# Patient Record
Sex: Female | Born: 1990 | Race: White | Hispanic: No | Marital: Married | Smoking: Never smoker
Health system: Southern US, Community
[De-identification: ages and names within clinical notes are randomized; demographics above are authoritative.]

## PROBLEM LIST (undated history)

## (undated) DIAGNOSIS — G43909 Migraine, unspecified, not intractable, without status migrainosus: Secondary | ICD-10-CM

## (undated) HISTORY — DX: Migraine, unspecified, not intractable, without status migrainosus: G43.909

## (undated) HISTORY — PX: DILATION AND CURETTAGE OF UTERUS: SHX78

---

## 2010-08-08 ENCOUNTER — Observation Stay: Payer: Self-pay | Admitting: Specialist

## 2010-08-16 ENCOUNTER — Emergency Department: Payer: Self-pay | Admitting: Emergency Medicine

## 2011-02-27 ENCOUNTER — Ambulatory Visit: Payer: Self-pay | Admitting: Internal Medicine

## 2013-07-14 ENCOUNTER — Emergency Department: Payer: Self-pay | Admitting: Emergency Medicine

## 2013-07-14 LAB — COMPREHENSIVE METABOLIC PANEL
Anion Gap: 5 — ABNORMAL LOW (ref 7–16)
BUN: 13 mg/dL (ref 7–18)
Bilirubin,Total: 0.2 mg/dL (ref 0.2–1.0)
Calcium, Total: 8.8 mg/dL (ref 8.5–10.1)
Co2: 26 mmol/L (ref 21–32)
EGFR (African American): >60
Glucose: 114 mg/dL — ABNORMAL HIGH (ref 65–99)
SGOT(AST): 19 U/L (ref 15–37)
SGPT (ALT): 17 U/L (ref 12–78)
Sodium: 137 mmol/L (ref 136–145)
Total Protein: 7.8 g/dL (ref 6.4–8.2)

## 2013-07-14 LAB — URINALYSIS, COMPLETE
Bilirubin,UR: NEGATIVE
Glucose,UR: NEGATIVE mg/dL (ref 0–75)
Ketone: NEGATIVE
Nitrite: NEGATIVE
Ph: 6 (ref 4.5–8.0)
RBC,UR: 4 /HPF (ref 0–5)
Squamous Epithelial: 12
WBC UR: 18 /HPF (ref 0–5)

## 2013-07-14 LAB — CBC
HCT: 37.7 % (ref 35.0–47.0)
HGB: 12.5 g/dL (ref 12.0–16.0)
MCV: 84 fL (ref 80–100)
Platelet: 243 10*3/uL (ref 150–440)
RBC: 4.48 10*6/uL (ref 3.80–5.20)
RDW: 15.1 % — ABNORMAL HIGH (ref 11.5–14.5)

## 2013-07-14 LAB — HCG, QUANTITATIVE, PREGNANCY: Beta Hcg, Quant.: 6990 m[IU]/mL — ABNORMAL HIGH

## 2014-04-07 ENCOUNTER — Ambulatory Visit: Payer: Self-pay | Admitting: Medical

## 2015-09-25 ENCOUNTER — Ambulatory Visit (INDEPENDENT_AMBULATORY_CARE_PROVIDER_SITE_OTHER): Payer: BLUE CROSS/BLUE SHIELD | Admitting: Internal Medicine

## 2015-09-25 ENCOUNTER — Ambulatory Visit
Admission: RE | Admit: 2015-09-25 | Discharge: 2015-09-25 | Disposition: A | Discharge status: Home / Self Care | Payer: BLUE CROSS/BLUE SHIELD | Source: Ambulatory Visit | Attending: Internal Medicine | Admitting: Internal Medicine

## 2015-09-25 ENCOUNTER — Encounter: Payer: Self-pay | Admitting: Internal Medicine

## 2015-09-25 ENCOUNTER — Ambulatory Visit (INDEPENDENT_AMBULATORY_CARE_PROVIDER_SITE_OTHER)
Admission: RE | Admit: 2015-09-25 | Discharge: 2015-09-25 | Disposition: A | Discharge status: Home / Self Care | Payer: BLUE CROSS/BLUE SHIELD | Source: Ambulatory Visit | Attending: Internal Medicine | Admitting: Internal Medicine

## 2015-09-25 ENCOUNTER — Encounter (INDEPENDENT_AMBULATORY_CARE_PROVIDER_SITE_OTHER): Payer: Self-pay

## 2015-09-25 VITALS — BP 110/80 | HR 65 | Temp 98.4°F | Ht 64.0 in | Wt 205.0 lb

## 2015-09-25 DIAGNOSIS — M25542 Pain in joints of left hand: Secondary | ICD-10-CM

## 2015-09-25 DIAGNOSIS — M25541 Pain in joints of right hand: Secondary | ICD-10-CM

## 2015-09-25 DIAGNOSIS — G43819 Other migraine, intractable, without status migrainosus: Secondary | ICD-10-CM

## 2015-09-25 DIAGNOSIS — M2550 Pain in unspecified joint: Secondary | ICD-10-CM

## 2015-09-25 DIAGNOSIS — Z23 Encounter for immunization: Secondary | ICD-10-CM | POA: Diagnosis not present

## 2015-09-25 LAB — RHEUMATOID FACTOR

## 2015-09-25 MED ORDER — TOPIRAMATE 50 MG PO TABS: 50.0000 mg | ORAL_TABLET | Freq: Every day | ORAL | Status: DC

## 2015-09-25 MED ORDER — MELOXICAM 15 MG PO TABS: 15.0000 mg | ORAL_TABLET | Freq: Every day | ORAL | Status: DC

## 2015-09-25 MED ORDER — NORETHINDRONE 0.35 MG PO TABS: 1.0000 | ORAL_TABLET | Freq: Every day | ORAL | Status: DC

## 2015-09-25 MED ORDER — SUMATRIPTAN SUCCINATE 25 MG PO TABS
25.0000 mg | ORAL_TABLET | ORAL | Status: DC | PRN
Start: 2015-09-25 — End: 2015-11-21

## 2015-09-25 NOTE — Progress Notes (Signed)
HPI  Pt presents to the clinic today to establish care and for management of the conditions listed below. She has not had a PCP in many years, but reports she does go to GYN.  Migraines: These occur 3 x week. The are located behing her left eye and radiate to the back of her head. She describes the pain as sharp and stabbing. She has sensitivity ot light and sound. She has had nausea but denies vomiting. Currently she takes Excedrin Migraine. They last for 2 days. She was on Topamax and Imitrex in the past and would like to get restarted on both of those.  Fibromyalgia: She reports she was diagnosed with this in 2013. She does c/o multiple joint and muscle pains. She has noticed joint swelling, mainly in her hands. She reports a red rash that intermittently appears on the bridge of her nose and under her eyes. She does have a family history of lupus.  Flu: 07/2014 Tetanus: 2015 Pap Smear: 2013, follows with [facility] gyn Dentist: as needed   Past Medical History  Diagnosis Date  . Migraines     Current Outpatient Prescriptions  Medication Sig Dispense Refill  . aspirin 81 MG tablet Take 81 mg by mouth daily.    Marland Kitchen aspirin-acetaminophen-caffeine (EXCEDRIN MIGRAINE) 250-250-65 MG tablet Take 1 tablet by mouth as needed for headache.    . norethindrone (MICRONOR,CAMILA,ERRIN) 0.35 MG tablet Take 1 tablet by mouth daily.   0   No current facility-administered medications for this visit.    Allergies  Allergen Reactions  . Mefenamic Acid Anaphylaxis  . Amoxicillin Rash  . Penicillins Rash    History reviewed. No pertinent family history.  Social History   Social History  . Marital Status: Married    Spouse Name: N/A  . Number of Children: N/A  . Years of Education: N/A   Occupational History  . Not on file.   Social History Main Topics  . Smoking status: Never Smoker   . Smokeless tobacco: Not on file  . Alcohol Use: Not on file  . Drug Use: Not on file  . Sexual Activity:  Not on file   Other Topics Concern  . Not on file   Social History Narrative  . No narrative on file    ROS:  Constitutional: Denies fever, malaise, fatigue, headache or abrupt weight changes.  HEENT: Denies eye pain, eye redness, ear pain, ringing in the ears, wax buildup, runny nose, nasal congestion, bloody nose, or sore throat. Respiratory: Denies difficulty breathing, shortness of breath, cough or sputum production.   Cardiovascular: Denies chest pain, chest tightness, palpitations or swelling in the hands or feet.  Gastrointestinal: Denies abdominal pain, bloating, constipation, diarrhea or blood in the stool.  GU: Denies frequency, urgency, pain with urination, blood in urine, odor or discharge. Musculoskeletal: Pt reports muscle and joint pain. Denies decrease in range of motion, difficulty with gaitg.  Skin: Denies redness, rashes, lesions or ulcercations.  Neurological: Denies dizziness, difficulty with memory, difficulty with speech or problems with balance and coordination.  Psych: Denies anxiety, depression, SI/HI.  No other specific complaints in a complete review of systems (except as listed in HPI above).  PE: BP 110/80 mmHg  Pulse 65  Temp(Src) 98.4 F (36.9 C) (Oral)  Ht 5' 4"  (1.626 m)  Wt 205 lb (92.987 kg)  BMI 35.17 kg/m2  SpO2 98%  LMP 09/19/2015  Wt Readings from Last 3 Encounters:  09/25/15 205 lb (92.987 kg)    General: Appears  her stated age, obese in NAD. Cardiovascular: Normal rate and rhythm. S1,S2 noted.  No murmur, rubs or gallops noted.  Pulmonary/Chest: Normal effort and positive vesicular breath sounds. No respiratory distress. No wheezes, rales or ronchi noted.  Musculoskeletal: Normal range of motion of upper and lower extremity. Strength 5/5 BUE/BLE. No signs of joint swelling. No difficulty with gait.  Neurological: Alert and oriented.  Psychiatric: Mood and affect normal. Behavior is normal. Judgment and thought content normal.      BMET    Component Value Date/Time   NA 137 07/14/2013 1633   K 3.5 07/14/2013 1633   CL 106 07/14/2013 1633   CO2 26 07/14/2013 1633   GLUCOSE 114* 07/14/2013 1633   BUN 13 07/14/2013 1633   CREATININE 0.87 07/14/2013 1633   CALCIUM 8.8 07/14/2013 1633   GFRNONAA >60 07/14/2013 1633   GFRAA >60 07/14/2013 1633    Lipid Panel  No results found for: CHOL, TRIG, HDL, CHOLHDL, VLDL, LDLCALC  CBC    Component Value Date/Time   WBC 12.2* 07/14/2013 1633   RBC 4.48 07/14/2013 1633   HGB 12.5 07/14/2013 1633   HCT 37.7 07/14/2013 1633   PLT 243 07/14/2013 1633   MCV 84 07/14/2013 1633   MCH 28.0 07/14/2013 1633   MCHC 33.3 07/14/2013 1633   RDW 15.1* 07/14/2013 1633    Hgb A1C No results found for: HGBA1C   Assessment and Plan:  Migraines:  Will restart Topamax and Imitrex Medications sent electronically  Multiple joint pain and muscle pains:  Xray of bilateral hands today eRx for Meloxicam 15 mg daily- no Advil or Ibuprofen while on this Will check ANA, ESR, RF and CCP today  Will follow up after labs, RTC as needed

## 2015-09-25 NOTE — Patient Instructions (Signed)

## 2015-09-25 NOTE — Progress Notes (Signed)
Pre visit review using our clinic review tool, if applicable. No additional management support is needed unless otherwise documented below in the visit note. 

## 2015-09-25 NOTE — Addendum Note (Signed)
Addended by: Roena MaladyEVONTENNO, Blessing Ozga Y on: 09/25/2015 04:38 PM   Modules accepted: Orders

## 2015-09-26 LAB — ANA: ANA: POSITIVE — AB

## 2015-09-26 LAB — CYCLIC CITRUL PEPTIDE ANTIBODY, IGG: Cyclic Citrullin Peptide Ab: <16 Units

## 2015-09-26 LAB — ANTI-NUCLEAR AB-TITER (ANA TITER): ANA Titer 1: 1:80 {titer} — ABNORMAL HIGH

## 2015-09-26 LAB — SEDIMENTATION RATE: SED RATE: 59 mm/h — AB (ref 0–22)

## 2015-09-27 ENCOUNTER — Other Ambulatory Visit: Payer: Self-pay | Admitting: Internal Medicine

## 2015-09-27 DIAGNOSIS — R768 Other specified abnormal immunological findings in serum: Secondary | ICD-10-CM

## 2015-11-21 ENCOUNTER — Other Ambulatory Visit: Payer: Self-pay | Admitting: Internal Medicine

## 2016-01-26 ENCOUNTER — Other Ambulatory Visit: Payer: Self-pay | Admitting: Internal Medicine

## 2016-01-28 ENCOUNTER — Other Ambulatory Visit: Payer: Self-pay | Admitting: Internal Medicine

## 2016-01-28 NOTE — Telephone Encounter (Signed)
Pt Est care with Nicki Reaperegina Baity, NP on 09/25/15, last refilled on 11/21/15 #30 with 1 additional refill, please advise

## 2016-06-21 ENCOUNTER — Other Ambulatory Visit: Payer: Self-pay | Admitting: Internal Medicine

## 2016-06-24 ENCOUNTER — Other Ambulatory Visit: Payer: Self-pay | Admitting: Internal Medicine

## 2016-07-13 ENCOUNTER — Other Ambulatory Visit: Payer: Self-pay | Admitting: Internal Medicine

## 2016-07-14 ENCOUNTER — Other Ambulatory Visit: Payer: Self-pay | Admitting: Internal Medicine

## 2016-08-05 ENCOUNTER — Other Ambulatory Visit: Payer: Self-pay | Admitting: Internal Medicine

## 2016-08-20 ENCOUNTER — Other Ambulatory Visit: Payer: Self-pay | Admitting: Internal Medicine

## 2016-10-01 ENCOUNTER — Encounter: Payer: Self-pay | Admitting: Primary Care

## 2016-10-01 ENCOUNTER — Ambulatory Visit (INDEPENDENT_AMBULATORY_CARE_PROVIDER_SITE_OTHER): Payer: BLUE CROSS/BLUE SHIELD | Admitting: Primary Care

## 2016-10-01 VITALS — BP 110/82 | HR 72 | Temp 98.8°F | Wt 196.0 lb

## 2016-10-01 DIAGNOSIS — R1010 Upper abdominal pain, unspecified: Secondary | ICD-10-CM | POA: Diagnosis not present

## 2016-10-01 LAB — COMPREHENSIVE METABOLIC PANEL
ALBUMIN: 4.2 g/dL (ref 3.5–5.2)
ALT: 11 U/L (ref 0–35)
AST: 13 U/L (ref 0–37)
Alkaline Phosphatase: 73 U/L (ref 39–117)
BUN: 14 mg/dL (ref 6–23)
CHLORIDE: 104 meq/L (ref 96–112)
CO2: 25 meq/L (ref 19–32)
CREATININE: 0.84 mg/dL (ref 0.40–1.20)
Calcium: 9.4 mg/dL (ref 8.4–10.5)
GFR: 86.27 mL/min (ref >60.00)
Glucose, Bld: 90 mg/dL (ref 70–99)
Potassium: 3.9 mEq/L (ref 3.5–5.1)
SODIUM: 138 meq/L (ref 135–145)
Total Bilirubin: 0.4 mg/dL (ref 0.2–1.2)
Total Protein: 7.9 g/dL (ref 6.0–8.3)

## 2016-10-01 LAB — CBC WITH DIFFERENTIAL/PLATELET
BASOS PCT: 0.4 % (ref 0.0–3.0)
Basophils Absolute: 0 10*3/uL (ref 0.0–0.1)
EOS ABS: 1.3 10*3/uL — AB (ref 0.0–0.7)
Eosinophils Relative: 13.8 % — ABNORMAL HIGH (ref 0.0–5.0)
HCT: 38.3 % (ref 36.0–46.0)
HEMOGLOBIN: 12.5 g/dL (ref 12.0–15.0)
Lymphocytes Relative: 25.2 % (ref 12.0–46.0)
Lymphs Abs: 2.4 10*3/uL (ref 0.7–4.0)
MCHC: 32.8 g/dL (ref 30.0–36.0)
MCV: 82.6 fl (ref 78.0–100.0)
MONO ABS: 0.5 10*3/uL (ref 0.1–1.0)
Monocytes Relative: 5.4 % (ref 3.0–12.0)
Neutro Abs: 5.2 10*3/uL (ref 1.4–7.7)
Neutrophils Relative %: 55.2 % (ref 43.0–77.0)
Platelets: 232 10*3/uL (ref 150.0–400.0)
RBC: 4.63 Mil/uL (ref 3.87–5.11)
RDW: 15.8 % — AB (ref 11.5–15.5)
WBC: 9.5 10*3/uL (ref 4.0–10.5)

## 2016-10-01 LAB — LIPASE: LIPASE: 15 U/L (ref 11.0–59.0)

## 2016-10-01 NOTE — Patient Instructions (Signed)
Complete lab work prior to leaving today. I will notify you of your results once received.   Stop by the front desk and speak with either Shirlee LimerickMarion or Zella BallRobin regarding your Ultrasound.  I will be in touch with you once I receive these results.  It was a pleasure to see you today!

## 2016-10-01 NOTE — Progress Notes (Signed)
Subjective:    Patient ID: Crystal Bowen, female    DOB: 1991-10-13, 25 y.o.   MRN: [id number]  HPI  Crystal Bowen is a 25 year old female who presents today with a chief complaint of abdominal pain. Her pain is located to the peri-umbillical and epigastric region that has been present for the past 2 years. She describes her pain as a pressure and pulling sensation that is worse with lifting/moving heavy objects.   Over the past 1 month she's noticed more discomfort just from her pants pressing to that region. She has no prior history of hernia, but was told she had "muscle separation" after the birth of her 3 children. She's noticed nausea. She denies fevers, vomiting, bloody stools. She does have a history of IBS but these symptoms don't feel like IBS symptoms.   Review of Systems  Constitutional: Negative for fever.  Gastrointestinal: Positive for abdominal pain and nausea. Negative for abdominal distention, blood in stool and vomiting.  Neurological: Negative for weakness.       Past Medical History:  Diagnosis Date  . Migraines      Social History   Social History  . Marital status: Married    Spouse name: N/A  . Number of children: N/A  . Years of education: N/A   Occupational History  . Not on file.   Social History Main Topics  . Smoking status: Never Smoker  . Smokeless tobacco: Never Used  . Alcohol use No  . Drug use: No  . Sexual activity: Yes    Birth control/ protection: Pill   Other Topics Concern  . Not on file   Social History Narrative  . No narrative on file    Past Surgical History:  Procedure Laterality Date  . DILATION AND CURETTAGE OF UTERUS      Family History  Problem Relation Age of Onset  . Arthritis Mother   . Hyperlipidemia Father   . Arthritis Maternal Grandmother   . Hyperlipidemia Maternal Grandmother   . Heart disease Maternal Grandmother   . Hypertension Maternal Grandmother   . Arthritis Maternal Grandfather   .  Hyperlipidemia Maternal Grandfather   . Heart disease Maternal Grandfather   . Hypertension Maternal Grandfather   . Arthritis Paternal Grandmother   . Hyperlipidemia Paternal Grandmother   . Heart disease Paternal Grandmother   . Stroke Paternal Grandmother   . Hypertension Paternal Grandmother   . Arthritis Paternal Grandfather   . Hyperlipidemia Paternal Grandfather   . Heart disease Paternal Grandfather   . Hypertension Paternal Grandfather     Allergies  Allergen Reactions  . Mefenamic Acid Anaphylaxis  . Amoxicillin Rash  . Penicillins Rash    Current Outpatient Prescriptions on File Prior to Visit  Medication Sig Dispense Refill  . aspirin-acetaminophen-caffeine (EXCEDRIN MIGRAINE) 250-250-65 MG tablet Take 1 tablet by mouth as needed for headache.    . norethindrone (MICRONOR,CAMILA,ERRIN) 0.35 MG tablet Take 1 tablet (0.35 mg total) by mouth daily. NO MORE REFILLS WITHOUT ANNUAL PHYSICAL OFFICE VISIT 28 tablet 0  . SUMAtriptan (IMITREX) 25 MG tablet TAKE 1 TABLET BY MOUTH EVERY 2 HOURS AS NEEDED AND MAY REPEAT IN 2 HOURS IF NEEDED 10 tablet 0  . topiramate (TOPAMAX) 50 MG tablet Take 1 tablet (50 mg total) by mouth daily. PLEASE SCHEDULE ANNUAL PHYSICAL FOR December 2017 30 tablet 1   No current facility-administered medications on file prior to visit.     BP 110/82 (BP Location: Left Arm, Patient Position:  Sitting, Cuff Size: Normal)   Pulse 72   Temp 98.8 F (37.1 C) (Oral)   Wt 196 lb (88.9 kg)   SpO2 98%   BMI 33.64 kg/m    Objective:   Physical Exam  Constitutional: She appears well-nourished.  Neck: Neck supple.  Cardiovascular: Normal rate and regular rhythm.   Pulmonary/Chest: Effort normal and breath sounds normal.  Abdominal: Soft. Bowel sounds are normal. There is tenderness in the epigastric area and periumbilical area. There is no rebound, no tenderness at McBurney's point and negative Murphy's sign.  Skin: Skin is warm and dry.            Assessment & Plan:  Abdominal Pain:  Located to epigastric and peri-umbillical region x 2 years. Exam today with tenderness to to areas mentioned above. No obvious abdominal bulge when laying to sitting. Will obtain CBC, CMP, Lipase to rule out any other cause. Abdominal ultrasound ordered for further evaluation. May require surgical consult. Will wait for US results.  Crystal Bowen,Crystal Rockhill Kendal, NP

## 2016-10-01 NOTE — Progress Notes (Signed)
Pre visit review using our clinic review tool, if applicable. No additional management support is needed unless otherwise documented below in the visit note. 

## 2016-10-06 ENCOUNTER — Ambulatory Visit
Admission: RE | Admit: 2016-10-06 | Discharge: 2016-10-06 | Disposition: A | Discharge status: Home / Self Care | Payer: BLUE CROSS/BLUE SHIELD | Source: Ambulatory Visit | Attending: Primary Care | Admitting: Primary Care

## 2016-10-06 DIAGNOSIS — R1010 Upper abdominal pain, unspecified: Secondary | ICD-10-CM | POA: Insufficient documentation

## 2016-11-05 ENCOUNTER — Other Ambulatory Visit: Payer: Self-pay | Admitting: Internal Medicine

## 2016-11-13 ENCOUNTER — Other Ambulatory Visit: Payer: Self-pay | Admitting: Internal Medicine

## 2017-01-06 ENCOUNTER — Other Ambulatory Visit: Payer: Self-pay | Admitting: Internal Medicine

## 2017-01-07 ENCOUNTER — Other Ambulatory Visit: Payer: Self-pay | Admitting: Internal Medicine

## 2017-02-09 ENCOUNTER — Encounter: Payer: Self-pay | Admitting: Primary Care

## 2017-02-09 ENCOUNTER — Other Ambulatory Visit: Payer: Self-pay | Admitting: Internal Medicine

## 2017-07-07 ENCOUNTER — Ambulatory Visit (INDEPENDENT_AMBULATORY_CARE_PROVIDER_SITE_OTHER): Payer: BLUE CROSS/BLUE SHIELD | Admitting: Internal Medicine

## 2017-07-07 ENCOUNTER — Encounter: Payer: Self-pay | Admitting: Internal Medicine

## 2017-07-07 VITALS — BP 108/76 | HR 81 | Temp 98.2°F | Wt 187.0 lb

## 2017-07-07 DIAGNOSIS — B029 Zoster without complications: Secondary | ICD-10-CM | POA: Diagnosis not present

## 2017-07-07 NOTE — Patient Instructions (Signed)

## 2017-07-07 NOTE — Progress Notes (Signed)
Subjective:    Patient ID: Crystal Bowen, female    DOB: 1991-05-30, 26 y.o.   MRN: [id number]  HPI  Pt presents to the clinic today with c/o a rash. She noticed this 6 days ago. The rash started on her lower back and radiates around to her right leg and groin. The rash tingles and burns. She reports some of the lesion have drained clear cloudy fluid. She has not come in contact with anything that she is allergic to. She has not taken anything OTC.  Review of Systems      Past Medical History:  Diagnosis Date  . Migraines     Current Outpatient Prescriptions  Medication Sig Dispense Refill  . aspirin-acetaminophen-caffeine (EXCEDRIN MIGRAINE) 250-250-65 MG tablet Take 1 tablet by mouth as needed for headache.    . norethindrone (MICRONOR,CAMILA,ERRIN) 0.35 MG tablet TAKE 1 TABLET (0.35 MG TOTAL) BY MOUTH DAILY. NO MORE REFILLS WITHOUT ANNUAL EXAM VISIT (Patient not taking: Reported on 07/07/2017) 28 tablet 0  . SUMAtriptan (IMITREX) 25 MG tablet TAKE 1 TABLET BY MOUTH EVERY 2 HOURS AS NEEDED AND MAY REPEAT IN 2 HOURS IF NEEDED (Patient not taking: Reported on 07/07/2017) 10 tablet 0  . topiramate (TOPAMAX) 50 MG tablet Take 1 tablet (50 mg total) by mouth daily. NO MORE REFILLS MUST SCHEDULE ANNUAL EXAM (Patient not taking: Reported on 07/07/2017) 30 tablet 0   No current facility-administered medications for this visit.     Allergies  Allergen Reactions  . Mefenamic Acid Anaphylaxis  . Amoxicillin Rash  . Penicillins Rash    Family History  Problem Relation Age of Onset  . Arthritis Mother   . Hyperlipidemia Father   . Arthritis Maternal Grandmother   . Hyperlipidemia Maternal Grandmother   . Heart disease Maternal Grandmother   . Hypertension Maternal Grandmother   . Arthritis Maternal Grandfather   . Hyperlipidemia Maternal Grandfather   . Heart disease Maternal Grandfather   . Hypertension Maternal Grandfather   . Arthritis Paternal Grandmother   .  Hyperlipidemia Paternal Grandmother   . Heart disease Paternal Grandmother   . Stroke Paternal Grandmother   . Hypertension Paternal Grandmother   . Arthritis Paternal Grandfather   . Hyperlipidemia Paternal Grandfather   . Heart disease Paternal Grandfather   . Hypertension Paternal Grandfather     Social History   Social History  . Marital status: Married    Spouse name: N/A  . Number of children: N/A  . Years of education: N/A   Occupational History  . Not on file.   Social History Main Topics  . Smoking status: Never Smoker  . Smokeless tobacco: Never Used  . Alcohol use No  . Drug use: No  . Sexual activity: Yes    Birth control/ protection: Pill   Other Topics Concern  . Not on file   Social History Narrative  . No narrative on file     Constitutional: Denies fever, malaise, fatigue, headache or abrupt weight changes.   Skin: Pt reports rash.   No other specific complaints in a complete review of systems (except as listed in HPI above).  Objective:   Physical Exam   BP 108/76   Pulse 81   Temp 98.2 F (36.8 C) (Oral)   Wt 187 lb (84.8 kg)   LMP 04/24/2017   SpO2 98%   BMI 32.10 kg/m  Wt Readings from Last 3 Encounters:  07/07/17 187 lb (84.8 kg)  10/01/16 196 lb (88.9 kg)  09/25/15  205 lb (93 kg)    General: Appears her stated age, in NAD. Skin: Scattered vesicular rash in dermatomal pattern noted on midline back extending around to right upper thigh.  BMET    Component Value Date/Time   NA 138 10/01/2016 1016   NA 137 07/14/2013 1633   K 3.9 10/01/2016 1016   K 3.5 07/14/2013 1633   CL 104 10/01/2016 1016   CL 106 07/14/2013 1633   CO2 25 10/01/2016 1016   CO2 26 07/14/2013 1633   GLUCOSE 90 10/01/2016 1016   GLUCOSE 114 (H) 07/14/2013 1633   BUN 14 10/01/2016 1016   BUN 13 07/14/2013 1633   CREATININE 0.84 10/01/2016 1016   CREATININE 0.87 07/14/2013 1633   CALCIUM 9.4 10/01/2016 1016   CALCIUM 8.8 07/14/2013 1633   GFRNONAA  >60 07/14/2013 1633   GFRAA >60 07/14/2013 1633    Lipid Panel  No results found for: CHOL, TRIG, HDL, CHOLHDL, VLDL, LDLCALC  CBC    Component Value Date/Time   WBC 9.5 10/01/2016 1016   RBC 4.63 10/01/2016 1016   HGB 12.5 10/01/2016 1016   HGB 12.5 07/14/2013 1633   HCT 38.3 10/01/2016 1016   HCT 37.7 07/14/2013 1633   PLT 232.0 10/01/2016 1016   PLT 243 07/14/2013 1633   MCV 82.6 10/01/2016 1016   MCV 84 07/14/2013 1633   MCH 28.0 07/14/2013 1633   MCHC 32.8 10/01/2016 1016   RDW 15.8 (H) 10/01/2016 1016   RDW 15.1 (H) 07/14/2013 1633   LYMPHSABS 2.4 10/01/2016 1016   MONOABS 0.5 10/01/2016 1016   EOSABS 1.3 (H) 10/01/2016 1016   BASOSABS 0.0 10/01/2016 1016    Hgb A1C No results found for: HGBA1C        Assessment & Plan:   Shingles:  Discussed treating outside the 72 window Discussed risks of medications vs risk of not treating Discussed risk to fetus, she reports she will discus this with OB She declines Valtrex 1 gm TID x 7 days Advised her to stay away from infants < 1 year, cancer pt, transplant pt or other know immunocomprimised pts  Return precautions discussed Nicki Reaper, NP

## 2017-11-19 IMAGING — US US ABDOMEN LIMITED
1 series · 14 of 25 positions shown · non-contrast
Comparison: None in PACs

CLINICAL DATA: Periumbilical and epigastric abdominal pain for the
past 2 years with increased symptoms over the past week. Patient is
undergoing evaluation for possible umbilical hernia.

EXAM:
LIMITED ABDOMINAL ULTRASOUND

[Series 1: us abdomen limited · 0.13mm/px · 14 of 27 slices shown]
[im 1/27]
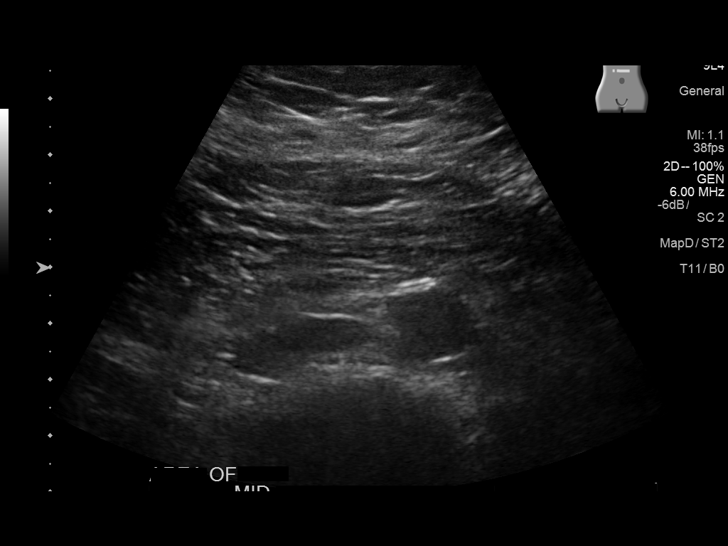
[im 3/27]
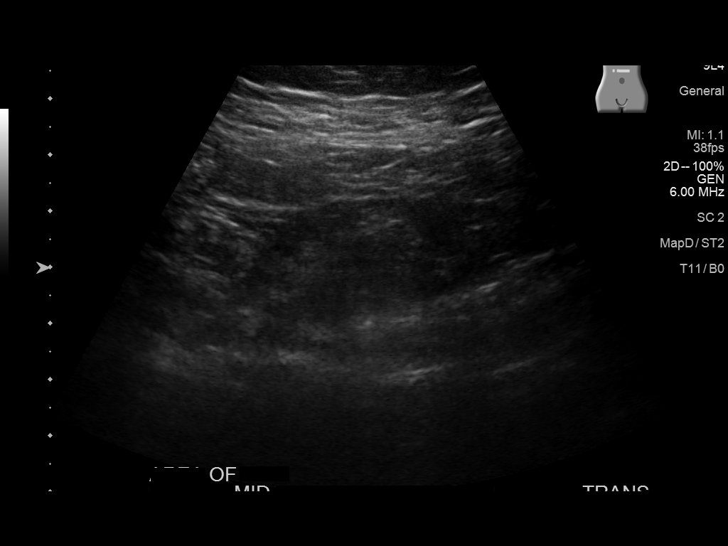
[im 5/27]
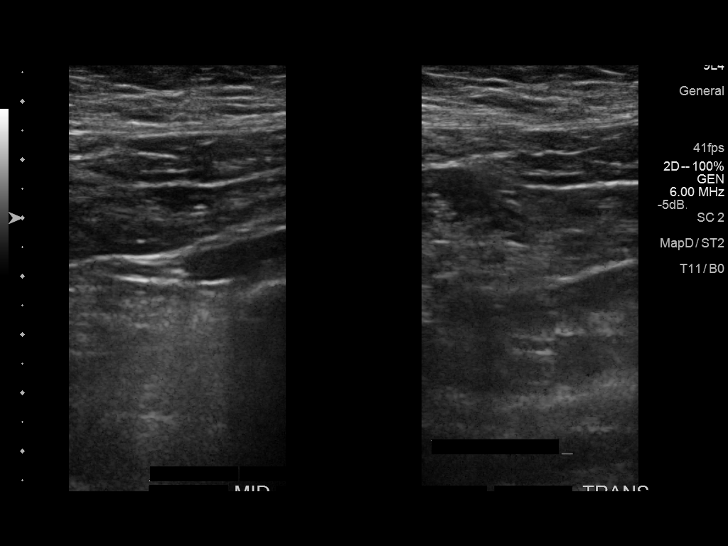
[im 7/27]
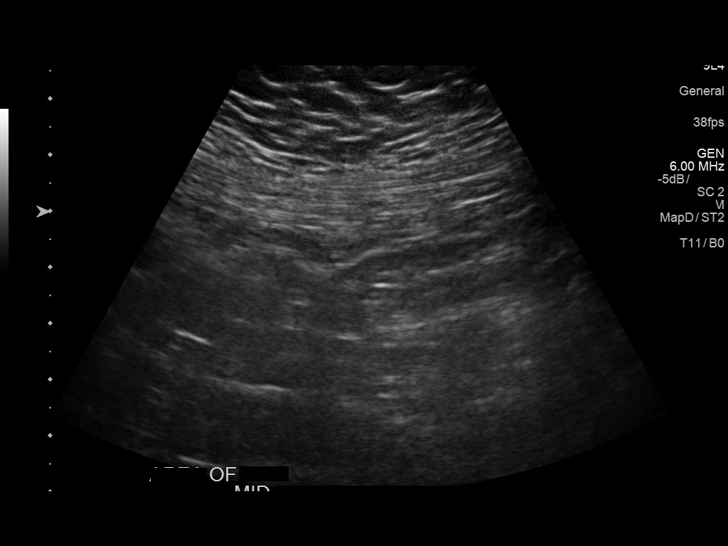
[im 9/27]
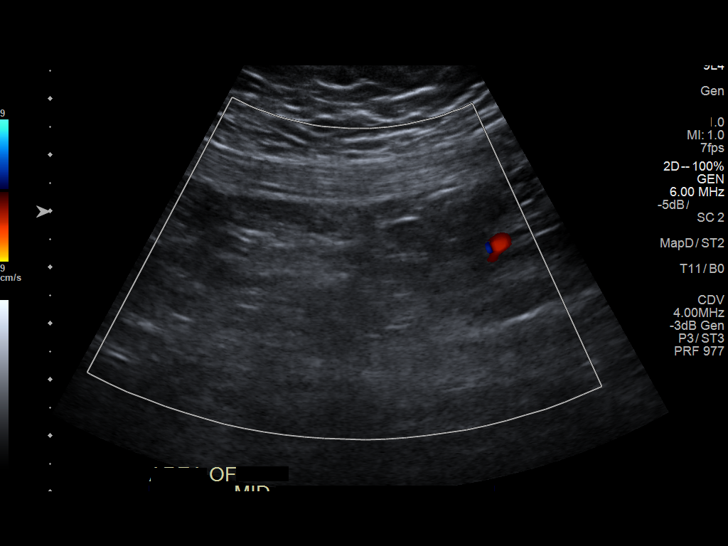
[im 10/27]
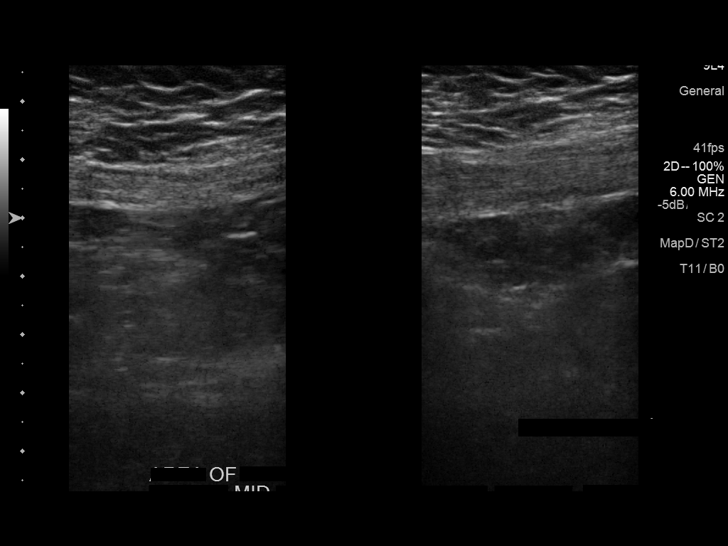
[im 12/27]
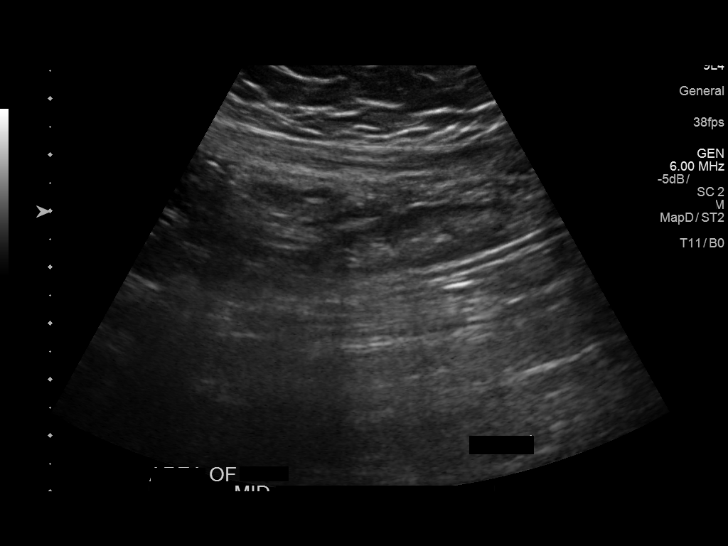
[im 15/27]
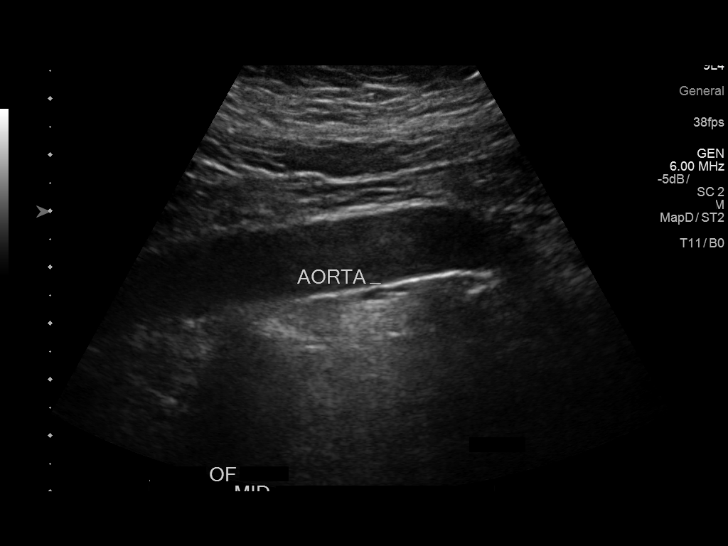
[im 17/27]
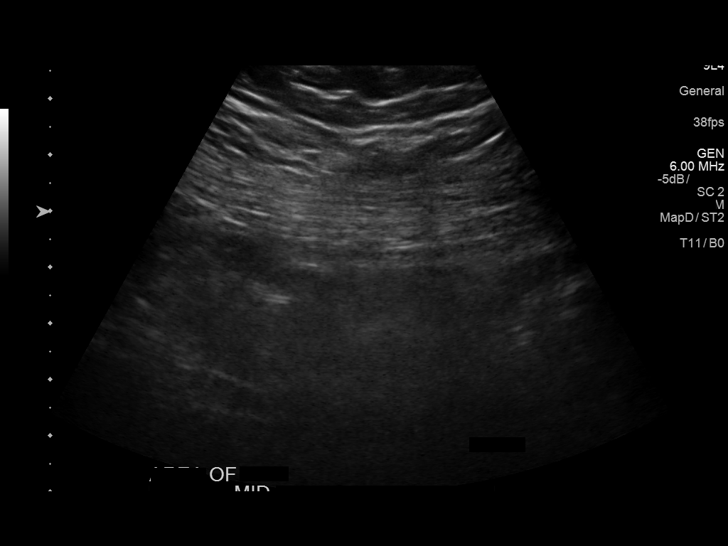
[im 18/27]
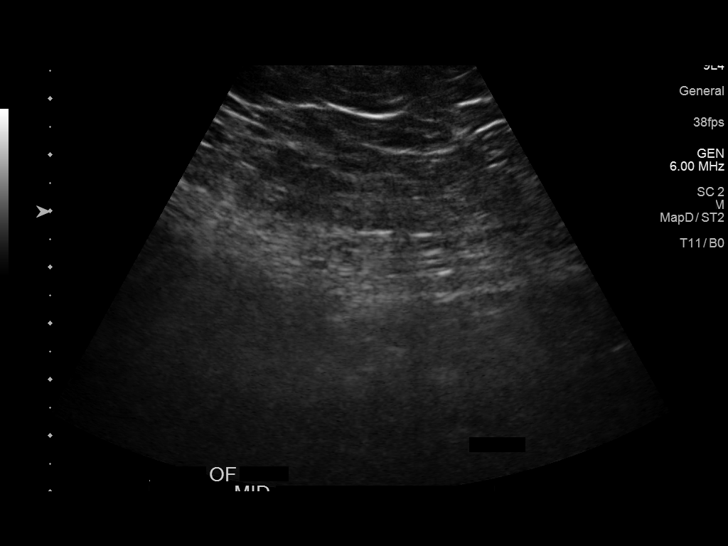
[im 20/27]
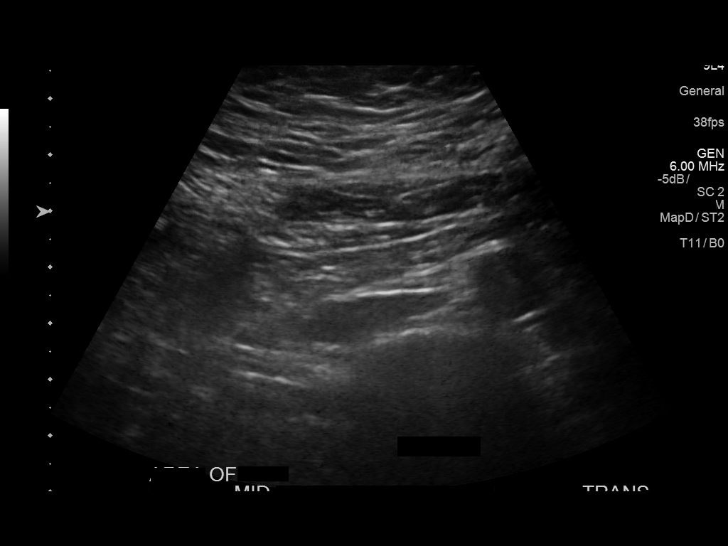
[im 22/27]
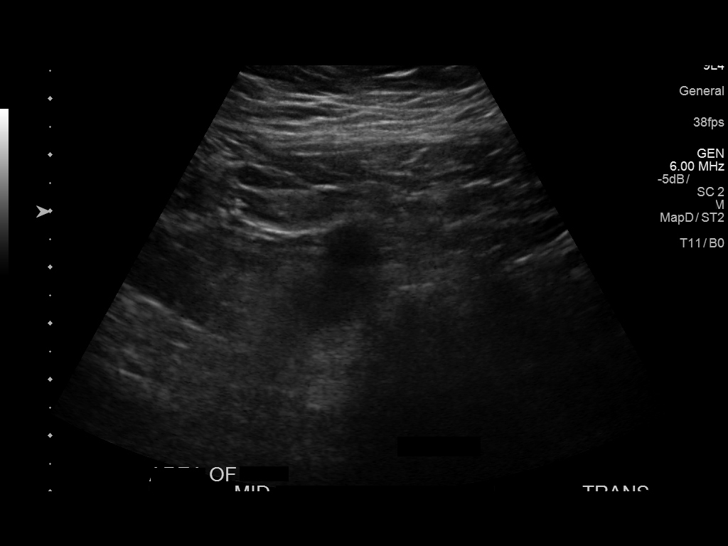
[im 24/27]
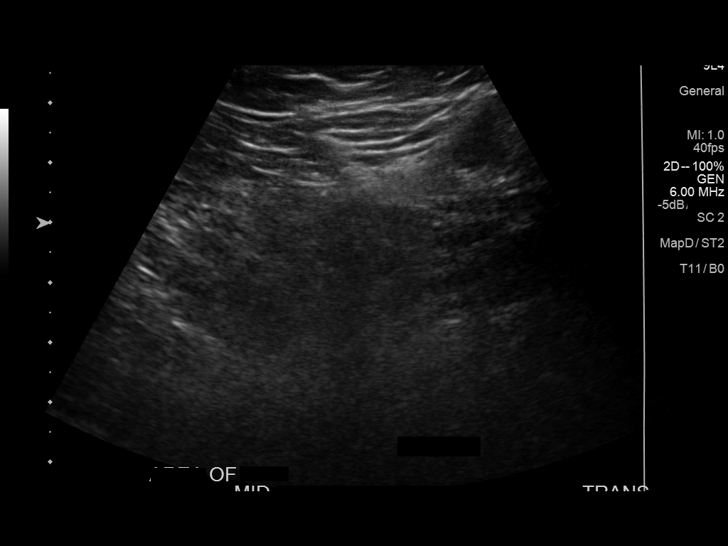
[im 27/27]
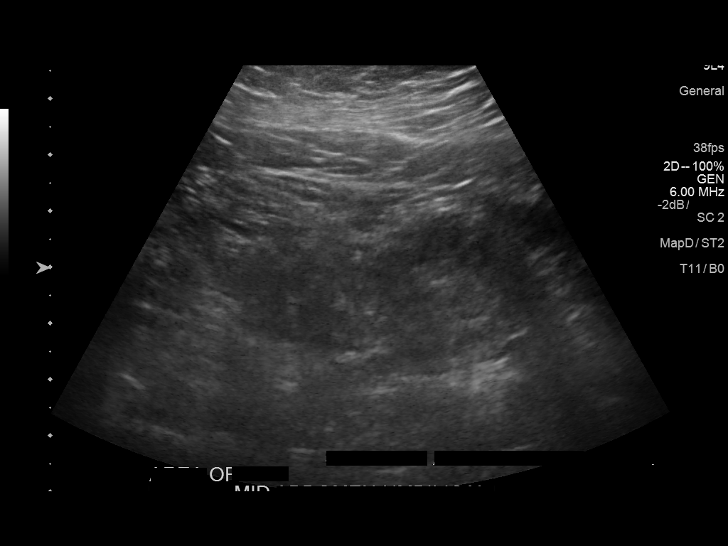

[14 of 25 positions shown; findings below may reference images not displayed]

FINDINGS: Evaluation of the area of symptoms in the mid abdominal
periumbilical region reveals no abnormal soft tissue mass or
evidence of a hernia sac. The echotexture within the subcutaneous
and deeper soft tissues is normal.
IMPRESSION: No umbilical hernia is observed on today's study.

## 2017-12-05 IMAGING — CR DG HAND COMPLETE 3+V*L*
3 series · 3 of 3 positions shown · non-contrast
Comparison: None in PACs

CLINICAL DATA: Three months of bilateral hand pain

EXAM:
LEFT HAND - COMPLETE 3+ VIEW; RIGHT HAND - COMPLETE 3+ VIEW

[view not recorded (1 of 3)]
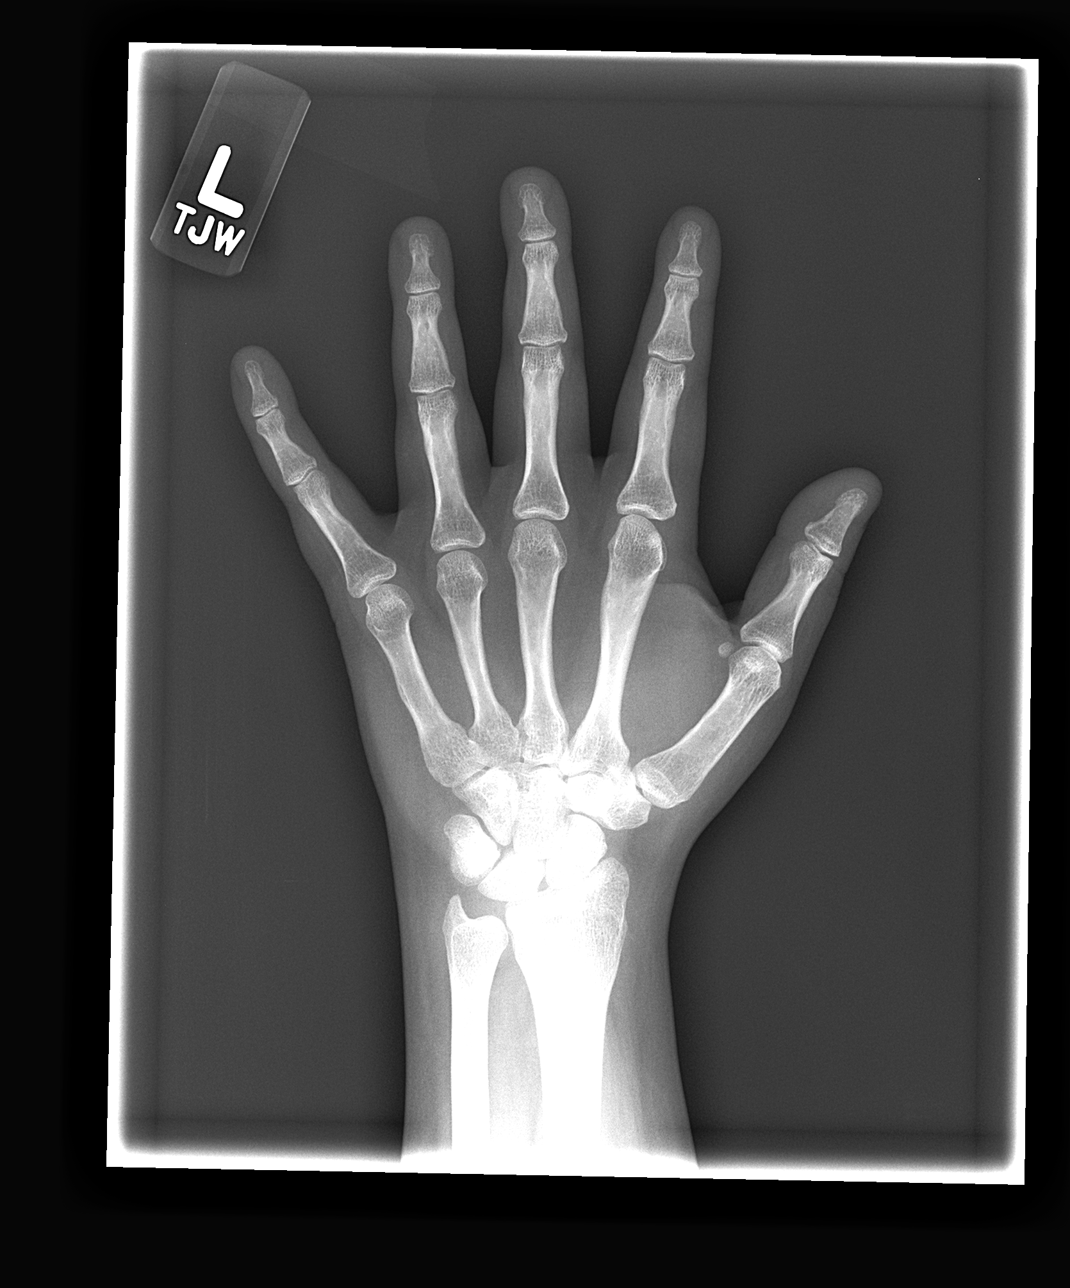

[view not recorded (2 of 3)]
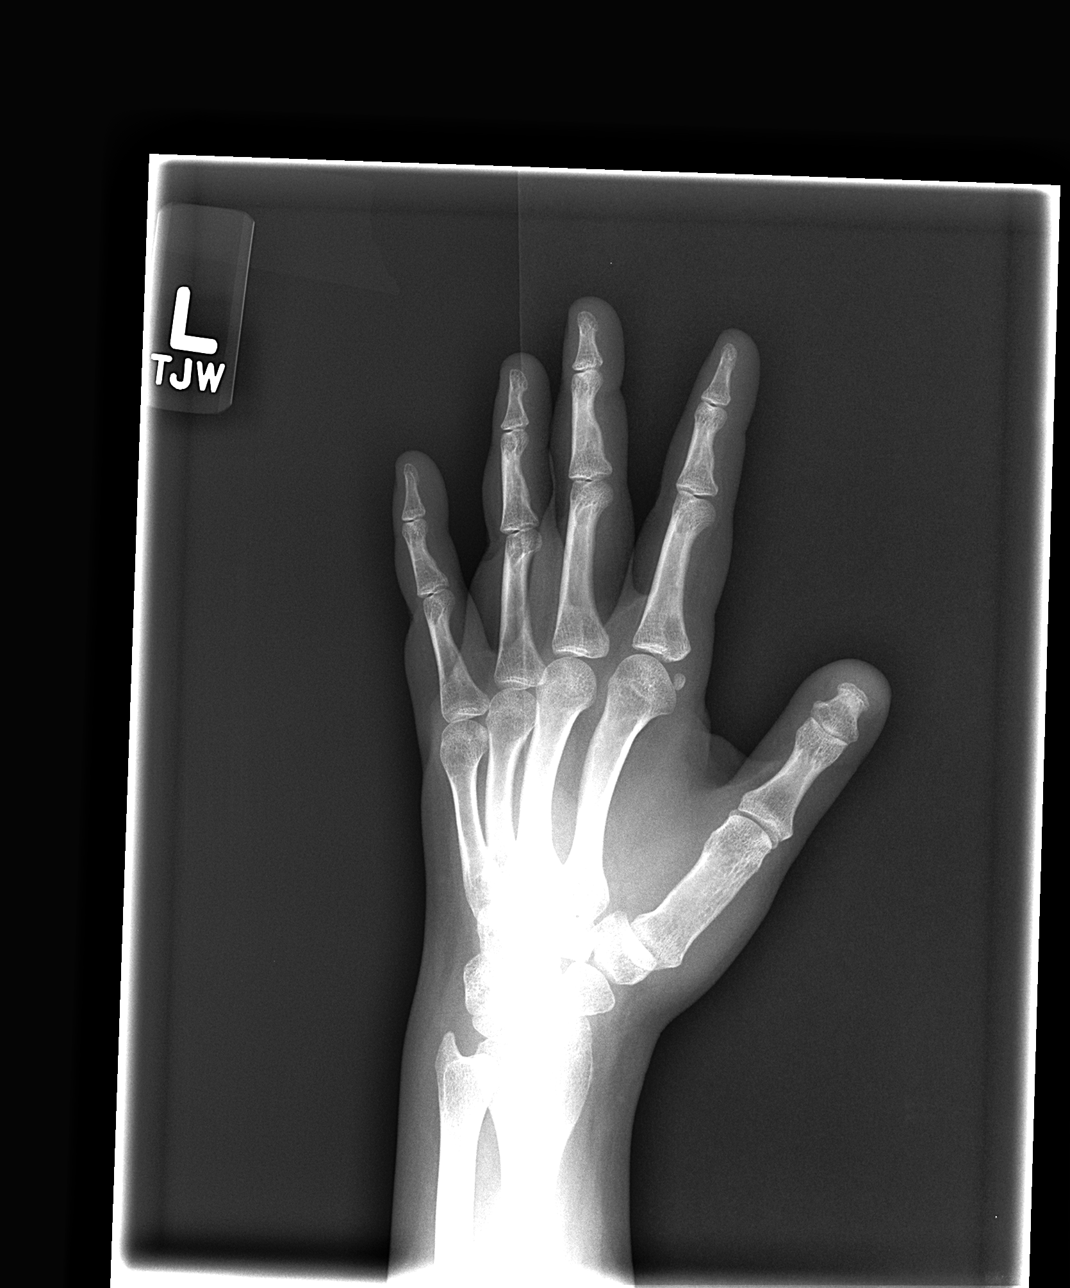

[view not recorded (3 of 3)]
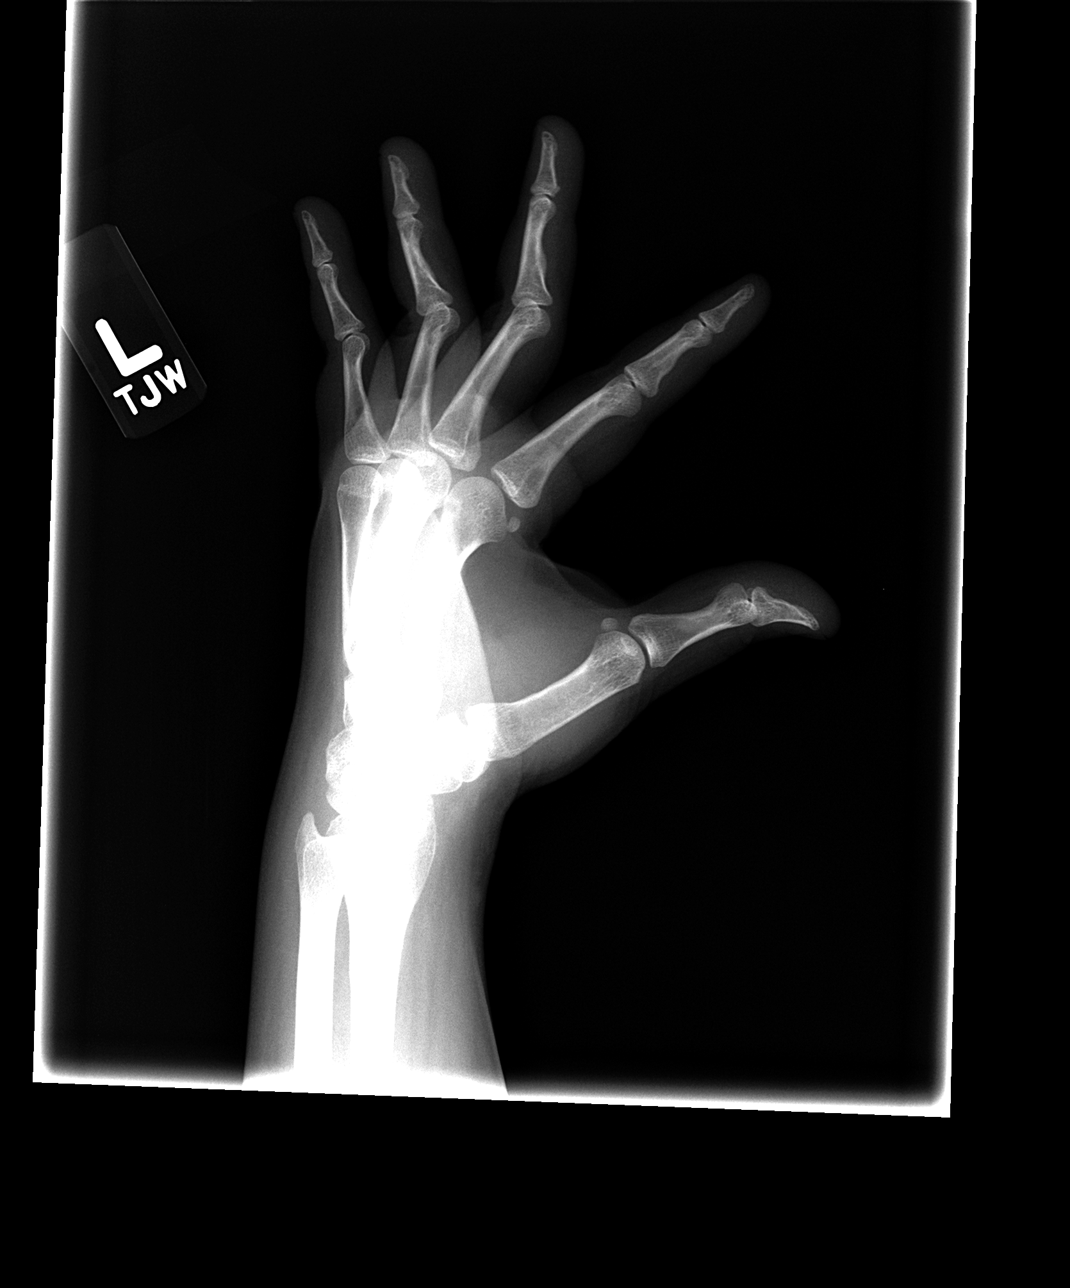

[3 of 3 positions shown; findings below may reference images not displayed]

FINDINGS: Bilateral hands: The bones of the hands are adequately mineralized.
There is no lytic or blastic lesion. There is no juxta-articular
osteopenia. No erosive changes of the joints are observed. The
interphalangeal, metacarpophalangeal, and carpometacarpal joint
spaces are preserved. There are no abnormal soft tissue
calcifications. The carpal bones and distal radius and ulna are
intact where visualized.
IMPRESSION: There is no evidence of arthritic change of the bones of the hands.
There is no acute or old fracture or dislocation. The soft tissues
are unremarkable.

## 2018-02-04 MED ORDER — ALUM & MAG HYDROXIDE-SIMETH 400-400-40 MG/5ML PO SUSP
15.00 | ORAL | Status: DC
Start: ? — End: 2018-02-04

## 2018-02-04 MED ORDER — ONDANSETRON 4 MG PO TBDP
4.00 | ORAL_TABLET | ORAL | Status: DC
Start: ? — End: 2018-02-04

## 2018-02-04 MED ORDER — DOCUSATE SODIUM 100 MG PO CAPS
100.00 | ORAL_CAPSULE | ORAL | Status: DC
Start: ? — End: 2018-02-04

## 2018-02-04 MED ORDER — MAGNESIUM HYDROXIDE 400 MG/5ML PO SUSP
30.00 | ORAL | Status: DC
Start: ? — End: 2018-02-04

## 2018-02-04 MED ORDER — IRON PO
1.00 | ORAL | Status: DC
Start: 2018-02-05 — End: 2018-02-04

## 2018-02-04 MED ORDER — ACETAMINOPHEN 325 MG PO TABS
650.00 | ORAL_TABLET | ORAL | Status: DC
Start: ? — End: 2018-02-04

## 2018-02-04 MED ORDER — IBUPROFEN 600 MG PO TABS
600.00 | ORAL_TABLET | ORAL | Status: DC
Start: 2018-02-04 — End: 2018-02-04

## 2018-02-04 MED ORDER — DIPHENHYDRAMINE HCL 25 MG PO CAPS
25.00 | ORAL_CAPSULE | ORAL | Status: DC
Start: ? — End: 2018-02-04

## 2018-02-04 MED ORDER — SIMETHICONE 80 MG PO CHEW
80.00 | CHEWABLE_TABLET | ORAL | Status: DC
Start: ? — End: 2018-02-04

## 2018-03-12 ENCOUNTER — Ambulatory Visit (INDEPENDENT_AMBULATORY_CARE_PROVIDER_SITE_OTHER): Payer: Medicaid Other | Admitting: Internal Medicine

## 2018-03-12 ENCOUNTER — Encounter: Payer: Self-pay | Admitting: Internal Medicine

## 2018-03-12 VITALS — BP 112/76 | HR 77 | Temp 97.9°F | Wt 212.0 lb

## 2018-03-12 DIAGNOSIS — O99345 Other mental disorders complicating the puerperium: Secondary | ICD-10-CM

## 2018-03-12 DIAGNOSIS — F53 Postpartum depression: Secondary | ICD-10-CM

## 2018-03-12 MED ORDER — SERTRALINE HCL 50 MG PO TABS: 50.0000 mg | ORAL_TABLET | Freq: Every day | ORAL | 2 refills | Status: DC

## 2018-03-12 NOTE — Progress Notes (Signed)
Subjective:    Patient ID: Crystal Bowen, female    DOB: 19-Mar-1991, 27 y.o.   MRN: [id number]  HPI  Pt presents to the clinic today with c/o emotional issues. She is postpartum, delivered 4/19. She is having trouble falling asleep and staying asleep. Her appetite is all over the place. She is angry at times, intermittent crying. She reports she has fleeting thoughts, but denies SI/HI. She has a very supportive husband, parents and friend. She reports she had postpartum depression with her 1st child, but it was untreated.   Review of Systems      Past Medical History:  Diagnosis Date  . Migraines     Current Outpatient Medications  Medication Sig Dispense Refill  . aspirin-acetaminophen-caffeine (EXCEDRIN MIGRAINE) 250-250-65 MG tablet Take 1 tablet by mouth as needed for headache.    . norethindrone (MICRONOR,CAMILA,ERRIN) 0.35 MG tablet TAKE 1 TABLET (0.35 MG TOTAL) BY MOUTH DAILY. NO MORE REFILLS WITHOUT ANNUAL EXAM VISIT 28 tablet 0   No current facility-administered medications for this visit.     Allergies  Allergen Reactions  . Mefenamic Acid Anaphylaxis  . Amoxicillin Rash  . Penicillins Rash    Family History  Problem Relation Age of Onset  . Arthritis Mother   . Hyperlipidemia Father   . Arthritis Maternal Grandmother   . Hyperlipidemia Maternal Grandmother   . Heart disease Maternal Grandmother   . Hypertension Maternal Grandmother   . Arthritis Maternal Grandfather   . Hyperlipidemia Maternal Grandfather   . Heart disease Maternal Grandfather   . Hypertension Maternal Grandfather   . Arthritis Paternal Grandmother   . Hyperlipidemia Paternal Grandmother   . Heart disease Paternal Grandmother   . Stroke Paternal Grandmother   . Hypertension Paternal Grandmother   . Arthritis Paternal Grandfather   . Hyperlipidemia Paternal Grandfather   . Heart disease Paternal Grandfather   . Hypertension Paternal Grandfather     Social History    Socioeconomic History  . Marital status: Married    Spouse name: Not on file  . Number of children: Not on file  . Years of education: Not on file  . Highest education level: Not on file  Occupational History  . Not on file  Social Needs  . Financial resource strain: Not on file  . Food insecurity:    Worry: Not on file    Inability: Not on file  . Transportation needs:    Medical: Not on file    Non-medical: Not on file  Tobacco Use  . Smoking status: Never Smoker  . Smokeless tobacco: Never Used  Substance and Sexual Activity  . Alcohol use: No    Alcohol/week: 0.0 oz  . Drug use: No  . Sexual activity: Yes    Birth control/protection: Pill  Lifestyle  . Physical activity:    Days per week: Not on file    Minutes per session: Not on file  . Stress: Not on file  Relationships  . Social connections:    Talks on phone: Not on file    Gets together: Not on file    Attends religious service: Not on file    Active member of club or organization: Not on file    Attends meetings of clubs or organizations: Not on file    Relationship status: Not on file  . Intimate partner violence:    Fear of current or ex partner: Not on file    Emotionally abused: Not on file    Physically  abused: Not on file    Forced sexual activity: Not on file  Other Topics Concern  . Not on file  Social History Narrative  . Not on file     Constitutional: Denies fever, malaise, fatigue, headache or abrupt weight changes.  Neurological: Denies dizziness, difficulty with memory, difficulty with speech or problems with balance and coordination.  Psych: Pt reports anxiety and depression. Denies SI/HI.  No other specific complaints in a complete review of systems (except as listed in HPI above).  Objective:   Physical Exam  BP 112/76   Pulse 77   Temp 97.9 F (36.6 C) (Oral)   Wt 212 lb (96.2 kg)   LMP 03/09/2018   SpO2 98%   Breastfeeding? Unknown   BMI 36.39 kg/m   Wt Readings  from Last 3 Encounters:  03/12/18 212 lb (96.2 kg)  07/07/17 187 lb (84.8 kg)  10/01/16 196 lb (88.9 kg)    General: Appears her stated age, obese, in NAD. Neurological: Alert and oriented.  Psychiatric: Mood and affect mildly flat. Judgment and thought content normal.    BMET    Component Value Date/Time   NA 138 10/01/2016 1016   NA 137 07/14/2013 1633   K 3.9 10/01/2016 1016   K 3.5 07/14/2013 1633   CL 104 10/01/2016 1016   CL 106 07/14/2013 1633   CO2 25 10/01/2016 1016   CO2 26 07/14/2013 1633   GLUCOSE 90 10/01/2016 1016   GLUCOSE 114 (H) 07/14/2013 1633   BUN 14 10/01/2016 1016   BUN 13 07/14/2013 1633   CREATININE 0.84 10/01/2016 1016   CREATININE 0.87 07/14/2013 1633   CALCIUM 9.4 10/01/2016 1016   CALCIUM 8.8 07/14/2013 1633   GFRNONAA >60 07/14/2013 1633   GFRAA >60 07/14/2013 1633    Lipid Panel  No results found for: CHOL, TRIG, HDL, CHOLHDL, VLDL, LDLCALC  CBC    Component Value Date/Time   WBC 9.5 10/01/2016 1016   RBC 4.63 10/01/2016 1016   HGB 12.5 10/01/2016 1016   HGB 12.5 07/14/2013 1633   HCT 38.3 10/01/2016 1016   HCT 37.7 07/14/2013 1633   PLT 232.0 10/01/2016 1016   PLT 243 07/14/2013 1633   MCV 82.6 10/01/2016 1016   MCV 84 07/14/2013 1633   MCH 28.0 07/14/2013 1633   MCHC 32.8 10/01/2016 1016   RDW 15.8 (H) 10/01/2016 1016   RDW 15.1 (H) 07/14/2013 1633   LYMPHSABS 2.4 10/01/2016 1016   MONOABS 0.5 10/01/2016 1016   EOSABS 1.3 (H) 10/01/2016 1016   BASOSABS 0.0 10/01/2016 1016    Hgb A1C No results found for: HGBA1C          Assessment & Plan:   Postpartum Depression:  Support offered today eRx for Sertraline 50 mg QHS Discussed side effects, benefits and risk of medication Keep behavioral health appt in June  Follow up with me in 1 month, sooner if needed Nicki Reaper, NP

## 2018-03-12 NOTE — Patient Instructions (Signed)
Postpartum Depression and Baby Blues The postpartum period begins right after the birth of a baby. During this time, there is often a great amount of joy and excitement. It is also a time of many changes in the life of the parents. Regardless of how many times a mother gives birth, each child brings new challenges and dynamics to the family. It is not unusual to have feelings of excitement along with confusing shifts in moods, emotions, and thoughts. All mothers are at risk of developing postpartum depression or the "baby blues." These mood changes can occur right after giving birth, or they may occur many months after giving birth. The baby blues or postpartum depression can be mild or severe. Additionally, postpartum depression can go away rather quickly, or it can be a long-term condition. What are the causes? Raised hormone levels and the rapid drop in those levels are thought to be a main cause of postpartum depression and the baby blues. A number of hormones change during and after pregnancy. Estrogen and progesterone usually decrease right after the delivery of your baby. The levels of thyroid hormone and various cortisol steroids also rapidly drop. Other factors that play a role in these mood changes include major life events and genetics. What increases the risk? If you have any of the following risks for the baby blues or postpartum depression, know what symptoms to watch out for during the postpartum period. Risk factors that may increase the likelihood of getting the baby blues or postpartum depression include:  Having a personal or family history of depression.  Having depression while being pregnant.  Having premenstrual mood issues or mood issues related to oral contraceptives.  Having a lot of life stress.  Having marital conflict.  Lacking a social support network.  Having a baby with special needs.  Having health problems, such as diabetes.  What are the signs or  symptoms? Symptoms of baby blues include:  Brief changes in mood, such as going from extreme happiness to sadness.  Decreased concentration.  Difficulty sleeping.  Crying spells, tearfulness.  Irritability.  Anxiety.  Symptoms of postpartum depression typically begin within the first month after giving birth. These symptoms include:  Difficulty sleeping or excessive sleepiness.  Marked weight loss.  Agitation.  Feelings of worthlessness.  Lack of interest in activity or food.  Postpartum psychosis is a very serious condition and can be dangerous. Fortunately, it is rare. Displaying any of the following symptoms is cause for immediate medical attention. Symptoms of postpartum psychosis include:  Hallucinations and delusions.  Bizarre or disorganized behavior.  Confusion or disorientation.  How is this diagnosed? A diagnosis is made by an evaluation of your symptoms. There are no medical or lab tests that lead to a diagnosis, but there are various questionnaires that a health care provider may use to identify those with the baby blues, postpartum depression, or psychosis. Often, a screening tool called the Edinburgh Postnatal Depression Scale is used to diagnose depression in the postpartum period. How is this treated? The baby blues usually goes away on its own in 1-2 weeks. Social support is often all that is needed. You will be encouraged to get adequate sleep and rest. Occasionally, you may be given medicines to help you sleep. Postpartum depression requires treatment because it can last several months or longer if it is not treated. Treatment may include individual or group therapy, medicine, or both to address any social, physiological, and psychological factors that may play a role in the   depression. Regular exercise, a healthy diet, rest, and social support may also be strongly recommended. Postpartum psychosis is more serious and needs treatment right away.  Hospitalization is often needed. Follow these instructions at home:  Get as much rest as you can. Nap when the baby sleeps.  Exercise regularly. Some women find yoga and walking to be beneficial.  Eat a balanced and nourishing diet.  Do little things that you enjoy. Have a cup of tea, take a bubble bath, read your favorite magazine, or listen to your favorite music.  Avoid alcohol.  Ask for help with household chores, cooking, grocery shopping, or running errands as needed. Do not try to do everything.  Talk to people close to you about how you are feeling. Get support from your partner, family members, friends, or other new moms.  Try to stay positive in how you think. Think about the things you are grateful for.  Do not spend a lot of time alone.  Only take over-the-counter or prescription medicine as directed by your health care provider.  Keep all your postpartum appointments.  Let your health care provider know if you have any concerns. Contact a health care provider if: You are having a reaction to or problems with your medicine. Get help right away if:  You have suicidal feelings.  You think you may harm the baby or someone else. This information is not intended to replace advice given to you by your health care provider. Make sure you discuss any questions you have with your health care provider. Document Released: 07/10/2004 Document Revised: 03/13/2016 Document Reviewed: 07/18/2013 Elsevier Interactive Patient Education  2017 [employer]  

## 2018-03-14 ENCOUNTER — Encounter: Payer: Self-pay | Admitting: Internal Medicine

## 2018-04-03 ENCOUNTER — Other Ambulatory Visit: Payer: Self-pay | Admitting: Internal Medicine

## 2018-05-09 ENCOUNTER — Other Ambulatory Visit: Payer: Self-pay | Admitting: Internal Medicine

## 2018-05-11 NOTE — Telephone Encounter (Signed)
Pt was to f/u in 27mo. pls advise

## 2019-03-11 ENCOUNTER — Encounter: Payer: Self-pay | Admitting: Internal Medicine

## 2019-03-16 ENCOUNTER — Other Ambulatory Visit: Payer: Self-pay | Admitting: Internal Medicine

## 2019-03-16 ENCOUNTER — Other Ambulatory Visit: Payer: Self-pay | Admitting: Primary Care

## 2019-03-16 MED ORDER — TOPIRAMATE 50 MG PO TABS: 50.0000 mg | ORAL_TABLET | Freq: Every day | ORAL | 1 refills | Status: DC

## 2019-04-07 ENCOUNTER — Other Ambulatory Visit: Payer: Self-pay | Admitting: Internal Medicine

## 2019-05-09 ENCOUNTER — Other Ambulatory Visit: Payer: Self-pay | Admitting: Internal Medicine

## 2019-06-13 ENCOUNTER — Other Ambulatory Visit: Payer: Self-pay | Admitting: Internal Medicine

## 2019-07-19 ENCOUNTER — Other Ambulatory Visit: Payer: Self-pay | Admitting: Internal Medicine

## 2019-08-03 ENCOUNTER — Other Ambulatory Visit: Payer: Self-pay | Admitting: Internal Medicine

## 2019-08-31 ENCOUNTER — Other Ambulatory Visit: Payer: Self-pay

## 2019-08-31 DIAGNOSIS — Z20822 Contact with and (suspected) exposure to covid-19: Secondary | ICD-10-CM

## 2019-09-02 LAB — NOVEL CORONAVIRUS, NAA: SARS-CoV-2, NAA: DETECTED — AB

## 2020-01-26 ENCOUNTER — Ambulatory Visit: Payer: Medicaid Other | Attending: Internal Medicine

## 2020-01-26 DIAGNOSIS — Z23 Encounter for immunization: Secondary | ICD-10-CM

## 2020-01-26 NOTE — Progress Notes (Signed)
   Covid-[facility] Vaccination Clinic  Name:  Crystal Bowen    MRN: [id number] DOB: 1991/09/24  01/26/2020  Ms. Crystal Bowen was observed post Covid-19 immunization for 30 minutes based on pre-vaccination screening without incident. She was provided with Vaccine Information Sheet and instruction to access the V-Safe system.   Ms. Crystal Bowen was instructed to call 911 with any severe reactions post vaccine: Marland Kitchen Difficulty breathing  . Swelling of face and throat  . A fast heartbeat  . A bad rash all over body  . Dizziness and weakness   Immunizations Administered    Name Date Dose VIS Date Route   Pfizer COVID-19 Vaccine 01/26/2020 10:25 AM 0.3 mL 09/30/2019 Intramuscular   Manufacturer: [employer], [employer]   Lot: [id number]   NDC: [address]-7

## 2020-02-20 ENCOUNTER — Ambulatory Visit: Payer: Medicaid Other | Attending: Internal Medicine

## 2020-02-20 DIAGNOSIS — Z23 Encounter for immunization: Secondary | ICD-10-CM

## 2020-02-20 NOTE — Progress Notes (Signed)
   Covid-19 Vaccination Clinic  Name:  Kavya Haag    MRN: [id number] DOB: 1990/12/03  02/20/2020  Ms. Verlene Glantz was observed post Covid-19 immunization for 15 minutes without incident. She was provided with Vaccine Information Sheet and instruction to access the V-Safe system.   Ms. Guneet Delpino was instructed to call 911 with any severe reactions post vaccine: Marland Kitchen Difficulty breathing  . Swelling of face and throat  . A fast heartbeat  . A bad rash all over body  . Dizziness and weakness   Immunizations Administered    Name Date Dose VIS Date Route   Pfizer COVID-19 Vaccine 02/20/2020 10:04 AM 0.3 mL 12/14/2018 Intramuscular   Manufacturer: [employer], [employer]   Lot: [id number]   NDC: [address]-4
